# Patient Record
Sex: Female | Born: 1985 | Race: White | Hispanic: No | Marital: Single | Smoking: Current every day smoker
Health system: Southern US, Community
[De-identification: ages and names within clinical notes are randomized; demographics above are authoritative.]

---

## 2015-09-03 ENCOUNTER — Emergency Department (HOSPITAL_BASED_OUTPATIENT_CLINIC_OR_DEPARTMENT_OTHER): Payer: Medicaid Other

## 2015-09-03 ENCOUNTER — Encounter (HOSPITAL_BASED_OUTPATIENT_CLINIC_OR_DEPARTMENT_OTHER): Payer: Self-pay | Admitting: Emergency Medicine

## 2015-09-03 ENCOUNTER — Emergency Department (HOSPITAL_BASED_OUTPATIENT_CLINIC_OR_DEPARTMENT_OTHER)
Admission: EM | Admit: 2015-09-03 | Discharge: 2015-09-03 | Disposition: A | Discharge status: Home / Self Care | Payer: Medicaid Other | Attending: Emergency Medicine | Admitting: Emergency Medicine

## 2015-09-03 DIAGNOSIS — R102 Pelvic and perineal pain: Secondary | ICD-10-CM

## 2015-09-03 DIAGNOSIS — F17200 Nicotine dependence, unspecified, uncomplicated: Secondary | ICD-10-CM | POA: Diagnosis not present

## 2015-09-03 DIAGNOSIS — A5901 Trichomonal vulvovaginitis: Secondary | ICD-10-CM | POA: Diagnosis not present

## 2015-09-03 DIAGNOSIS — R1030 Lower abdominal pain, unspecified: Secondary | ICD-10-CM

## 2015-09-03 LAB — WET PREP, GENITAL
Sperm: NONE SEEN
Yeast Wet Prep HPF POC: NONE SEEN

## 2015-09-03 LAB — CBC WITH DIFFERENTIAL/PLATELET
BASOS PCT: 1 %
Basophils Absolute: 0 10*3/uL (ref 0.0–0.1)
EOS ABS: 0.3 10*3/uL (ref 0.0–0.7)
Eosinophils Relative: 4 %
HEMATOCRIT: 43.2 % (ref 36.0–46.0)
HEMOGLOBIN: 15.2 g/dL — AB (ref 12.0–15.0)
LYMPHS ABS: 1.8 10*3/uL (ref 0.7–4.0)
Lymphocytes Relative: 22 %
MCH: 30.6 pg (ref 26.0–34.0)
MCHC: 35.2 g/dL (ref 30.0–36.0)
MCV: 87.1 fL (ref 78.0–100.0)
MONOS PCT: 8 %
Monocytes Absolute: 0.6 10*3/uL (ref 0.1–1.0)
NEUTROS ABS: 5.2 10*3/uL (ref 1.7–7.7)
NEUTROS PCT: 65 %
Platelets: 327 10*3/uL (ref 150–400)
RBC: 4.96 MIL/uL (ref 3.87–5.11)
RDW: 12.6 % (ref 11.5–15.5)
WBC: 8 10*3/uL (ref 4.0–10.5)

## 2015-09-03 LAB — PREGNANCY, URINE: PREG TEST UR: NEGATIVE

## 2015-09-03 LAB — COMPREHENSIVE METABOLIC PANEL
ALBUMIN: 4 g/dL (ref 3.5–5.0)
ALK PHOS: 69 U/L (ref 38–126)
ALT: 15 U/L (ref 14–54)
ANION GAP: 8 (ref 5–15)
AST: 18 U/L (ref 15–41)
BILIRUBIN TOTAL: 0.5 mg/dL (ref 0.3–1.2)
CALCIUM: 9.2 mg/dL (ref 8.9–10.3)
CO2: 21 mmol/L — AB (ref 22–32)
CREATININE: 0.5 mg/dL (ref 0.44–1.00)
Chloride: 108 mmol/L (ref 101–111)
GFR calc Af Amer: >60 mL/min (ref >60)
GFR calc non Af Amer: >60 mL/min (ref >60)
GLUCOSE: 95 mg/dL (ref 65–99)
Potassium: 3.8 mmol/L (ref 3.5–5.1)
SODIUM: 137 mmol/L (ref 135–145)
TOTAL PROTEIN: 7.2 g/dL (ref 6.5–8.1)

## 2015-09-03 LAB — URINALYSIS, ROUTINE W REFLEX MICROSCOPIC
Bilirubin Urine: NEGATIVE
GLUCOSE, UA: NEGATIVE mg/dL
HGB URINE DIPSTICK: NEGATIVE
KETONES UR: NEGATIVE mg/dL
Nitrite: NEGATIVE
PH: 7.5 (ref 5.0–8.0)
PROTEIN: NEGATIVE mg/dL
Specific Gravity, Urine: 1.003 — ABNORMAL LOW (ref 1.005–1.030)

## 2015-09-03 LAB — URINE MICROSCOPIC-ADD ON: RBC / HPF: NONE SEEN RBC/hpf (ref 0–5)

## 2015-09-03 MED ORDER — LIDOCAINE HCL (PF) 1 % IJ SOLN
INTRAMUSCULAR | Status: AC
  Administered 2015-09-03: 2.1 mL
  Filled 2015-09-03: qty 5

## 2015-09-03 MED ORDER — MORPHINE SULFATE (PF) 4 MG/ML IV SOLN
4.0000 mg | Freq: Once | INTRAVENOUS | Status: AC
  Administered 2015-09-03: 4 mg via INTRAVENOUS
  Filled 2015-09-03: qty 1

## 2015-09-03 MED ORDER — ONDANSETRON HCL 4 MG/2ML IJ SOLN
4.0000 mg | Freq: Once | INTRAMUSCULAR | Status: AC
  Administered 2015-09-03: 4 mg via INTRAVENOUS
  Filled 2015-09-03: qty 2

## 2015-09-03 MED ORDER — METRONIDAZOLE 500 MG PO TABS
500.0000 mg | ORAL_TABLET | Freq: Once | ORAL | Status: AC
  Administered 2015-09-03: 500 mg via ORAL
  Filled 2015-09-03: qty 1

## 2015-09-03 MED ORDER — METRONIDAZOLE 500 MG PO TABS: 500.0000 mg | ORAL_TABLET | Freq: Two times a day (BID) | ORAL | 0 refills | Status: DC

## 2015-09-03 MED ORDER — AZITHROMYCIN 250 MG PO TABS
1000.0000 mg | ORAL_TABLET | Freq: Once | ORAL | Status: AC
  Administered 2015-09-03: 1000 mg via ORAL
  Filled 2015-09-03: qty 4

## 2015-09-03 MED ORDER — SODIUM CHLORIDE 0.9 % IV BOLUS (SEPSIS)
1000.0000 mL | Freq: Once | INTRAVENOUS | Status: AC
  Administered 2015-09-03: 1000 mL via INTRAVENOUS

## 2015-09-03 MED ORDER — KETOROLAC TROMETHAMINE 15 MG/ML IJ SOLN
15.0000 mg | Freq: Once | INTRAMUSCULAR | Status: AC
  Administered 2015-09-03: 15 mg via INTRAVENOUS
  Filled 2015-09-03: qty 1

## 2015-09-03 MED ORDER — CEFTRIAXONE SODIUM 250 MG IJ SOLR
250.0000 mg | Freq: Once | INTRAMUSCULAR | Status: AC
  Administered 2015-09-03: 250 mg via INTRAMUSCULAR
  Filled 2015-09-03: qty 250

## 2015-09-03 MED ORDER — TRAMADOL HCL 50 MG PO TABS: 50.0000 mg | ORAL_TABLET | Freq: Four times a day (QID) | ORAL | 0 refills | Status: DC | PRN

## 2015-09-03 NOTE — ED Triage Notes (Signed)
Patient states that she has had lower pelvic pain and abdominal pain x 2 days. Reports that she had a period 2 weeks ago but took a pregnancy tests  - 1 pos / 1 neg. Ptient states that she is having waves of pain to her lower abominal area that brings her to her knees. Also reports numbness to her arms and legs

## 2015-09-03 NOTE — ED Provider Notes (Signed)
[facility] Provider Note   CSN: [id number] Arrival date & time: 09/03/15  1113     History   Chief Complaint Chief Complaint  Patient presents with  . Pelvic Pain    HPI Autumn Hess is a 30 y.o. female.  Patient states vaginal bleeding. Several days ago and had mild abdominal pain intermittently with vomiting but today has had severe pain every 30 minutes. Denies any trauma.   The history is provided by the patient.  Pelvic Pain  This is a new problem. The current episode started 2 days ago. The problem occurs constantly. The problem has been gradually worsening. Associated symptoms include abdominal pain. Associated symptoms comments: Nausea/vomiting.  Vaginal bleeding stopped a few days ago.  Denies urinary sx or vaginal discharge.  IUD placed 8 years ago and wants it out but can't see OB until Nov.  Hx of heavy menses but not typical to have pain like this.  Worse over the left pelvis but also on the right.. The symptoms are aggravated by walking and bending. Nothing relieves the symptoms. She has tried nothing for the symptoms. The treatment provided no relief.    History reviewed. No pertinent past medical history.  There are no active problems to display for this patient.   History reviewed. No pertinent surgical history.  OB History    No data available       Home Medications    Prior to Admission medications   Not on File    Family History History reviewed. No pertinent family history.  Social History Social History  Substance Use Topics  . Smoking status: Current Every Day Smoker  . Smokeless tobacco: Never Used  . Alcohol use No     Allergies   Codeine and Sulfa antibiotics   Review of Systems Review of Systems  Gastrointestinal: Positive for abdominal pain.  Genitourinary: Positive for pelvic pain.  All other systems reviewed and are negative.    Physical Exam Updated Vital Signs BP 114/72 (BP Location: Right Arm)    Pulse 68   Temp 98.2 F (36.8 C) (Oral)   Resp 18   Ht 5\' 2"  (1.575 m)   Wt 180 lb (81.6 kg)   LMP 08/13/2015 (Approximate)   SpO2 100%   BMI 32.92 kg/m   Physical Exam  Constitutional: She is oriented to person, place, and time. She appears well-developed and well-nourished. No distress.  HENT:  Head: Normocephalic and atraumatic.  Mouth/Throat: Oropharynx is clear and moist.  Eyes: Conjunctivae and EOM are normal. Pupils are equal, round, and reactive to light.  Neck: Normal range of motion. Neck supple.  Cardiovascular: Normal rate, regular rhythm and intact distal pulses.   No murmur heard. Pulmonary/Chest: Effort normal and breath sounds normal. No respiratory distress. She has no wheezes. She has no rales.  Abdominal: Soft. She exhibits no distension. There is tenderness in the suprapubic area. There is no rebound and no guarding.    Lower pelvic tenderness bilaterally  Genitourinary: Uterus normal. Cervix exhibits no motion tenderness and no discharge. Right adnexum displays tenderness. Right adnexum displays no fullness. Left adnexum displays tenderness. Left adnexum displays no fullness. No tenderness or bleeding in the vagina. No vaginal discharge found.  Musculoskeletal: Normal range of motion. She exhibits no edema or tenderness.  Neurological: She is alert and oriented to person, place, and time.  Skin: Skin is warm and dry. No rash noted. No erythema.  Psychiatric: She has a normal mood and affect. Her behavior is  normal.  Nursing note and vitals reviewed.    ED Treatments / Results  Labs (all labs ordered are listed, but only abnormal results are displayed) Labs Reviewed  URINALYSIS, ROUTINE W REFLEX MICROSCOPIC (NOT AT [facility]) - Abnormal; Notable for the following:       Result Value   Specific Gravity, Urine 1.003 (*)    Leukocytes, UA SMALL (*)    All other components within normal limits  CBC WITH DIFFERENTIAL/PLATELET - Abnormal; Notable for the following:     Hemoglobin 15.2 (*)    All other components within normal limits  COMPREHENSIVE METABOLIC PANEL - Abnormal; Notable for the following:    CO2 21 (*)    BUN <5 (*)    All other components within normal limits  URINE MICROSCOPIC-ADD ON - Abnormal; Notable for the following:    Squamous Epithelial / LPF 0-5 (*)    Bacteria, UA FEW (*)    All other components within normal limits  WET PREP, GENITAL  PREGNANCY, URINE  GC/CHLAMYDIA PROBE AMP ([place]) NOT AT [facility]    EKG  EKG Interpretation None       Radiology No results found.  Procedures Procedures (including critical care time)  Medications Ordered in ED Medications  sodium chloride 0.9 % bolus 1,000 mL (1,000 mLs Intravenous New Bag/Given 09/03/15 1158)  morphine 4 MG/ML injection 4 mg (4 mg Intravenous Given 09/03/15 1208)  ondansetron (ZOFRAN) injection 4 mg (4 mg Intravenous Given 09/03/15 1207)     Initial Impression / Assessment and Plan / ED Course  I have reviewed the triage vital signs and the nursing notes.  Pertinent labs & imaging results that were available during my care of the patient were reviewed by me and considered in my medical decision making (see chart for details).  Clinical Course   Patient is a 30 year old healthy female who has a history of heavy menses and IUD that has been present for 8 years presenting today with lower abdominal/pelvic tenderness which causes nausea and vomiting that started 2 days ago. Patient states that she had an extremely heavy. This past week with passage of large clots which is now resolved but abdominal pain started 2 days ago. Is in the lower pelvic region and seems to be getting worse. When the pain is severe causes her to vomit but she denies any fever or bowel changes. No urinary symptoms and denies any vaginal discharge.  She is requesting IUD removal as she is been trying to get it out but can't get an appointment with with her GYN until November. On exam  patient's IUD was removed. Visual exam is within normal limits however she has significant tenderness with adnexal exam. Left adnexa is or tender than right adnexa. Concern for potential torsion versus ruptured ovarian cyst versus fibroids as her cause for pain. Low suspicion for appendicitis, diverticulitis, hepatitis or pancreatitis. Patient given pain control and ultrasound pending. CBC, CMP and UA all within normal limits. UPT is negative  2:09 PM Ultrasound without acute finding of torsion, cyst or tubo-ovarian abscess. Wet prep was positive for trichomonas. Patient treated with Rocephin, azithromycin and Flagyl.   Final Clinical Impressions(s) / ED Diagnoses   Final diagnoses:  Pelvic pain in female  Trichomonas vaginalis (TV) infection  Lower abdominal pain    New Prescriptions New Prescriptions   METRONIDAZOLE (FLAGYL) 500 MG TABLET    Take 1 tablet (500 mg total) by mouth 2 (two) times daily.   TRAMADOL (ULTRAM) 50 MG TABLET  Take 1 tablet (50 mg total) by mouth every 6 (six) hours as needed.     Gwyneth Sprout, MD 09/03/15 1410

## 2015-09-04 LAB — GC/CHLAMYDIA PROBE AMP (~~LOC~~) NOT AT ARMC
CHLAMYDIA, DNA PROBE: NEGATIVE
Neisseria Gonorrhea: NEGATIVE

## 2017-08-18 IMAGING — US US ART/VEN ABD/PELV/SCROTUM DOPPLER LTD
1 series · 13 of 25 positions shown · non-contrast
Comparison: None

CLINICAL DATA: Diffuse pelvic pain for 4 days.

EXAM:
TRANSABDOMINAL AND TRANSVAGINAL ULTRASOUND OF PELVIS
DOPPLER ULTRASOUND OF OVARIES
TECHNIQUE: Both transabdominal and transvaginal ultrasound examinations of the
pelvis were performed. Transabdominal technique was performed for
global imaging of the pelvis including uterus, ovaries, adnexal
regions, and pelvic cul-de-sac.
It was necessary to proceed with endovaginal exam following the
transabdominal exam to visualize the uterus, endometrium, ovaries
and adnexa . Color and duplex Doppler ultrasound was utilized to
evaluate blood flow to the ovaries.

[Series 1: us art/ven abd/pelv/scrotum doppler ltd · 0.24mm/px · 13 of 92 slices shown]
[im 1/92]
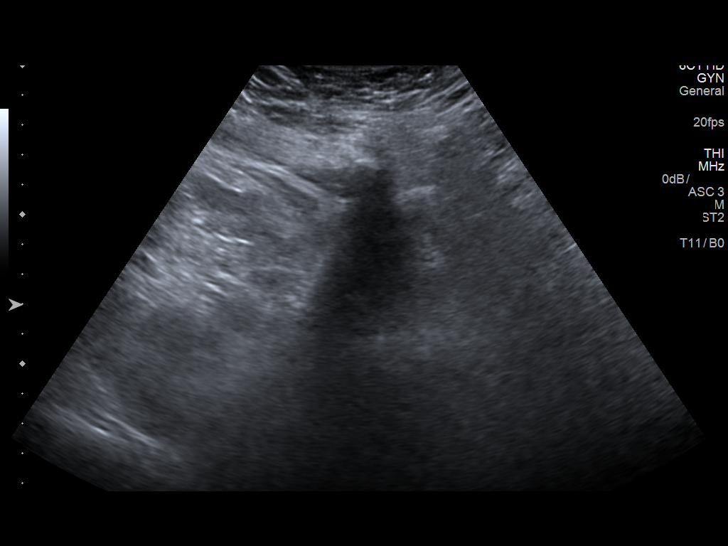
[im 8/92]
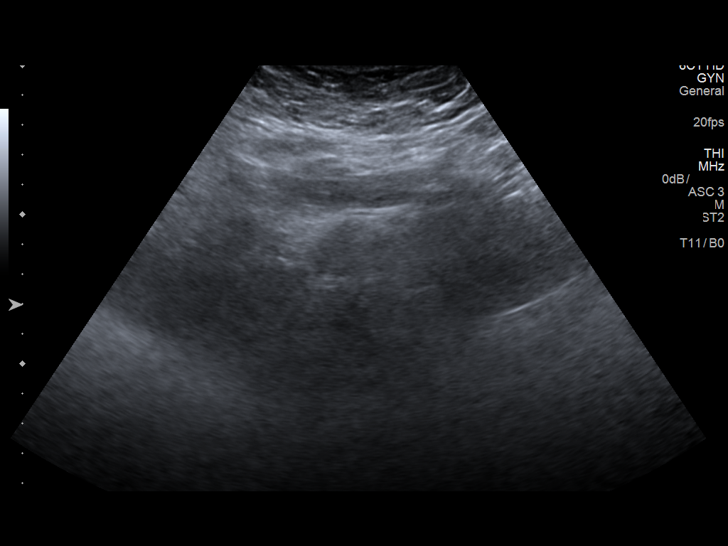
[im 16/92]
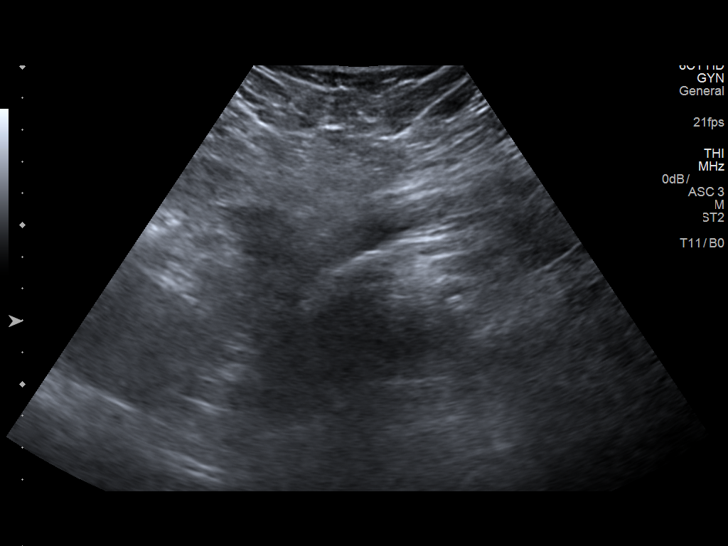
[im 23/92]
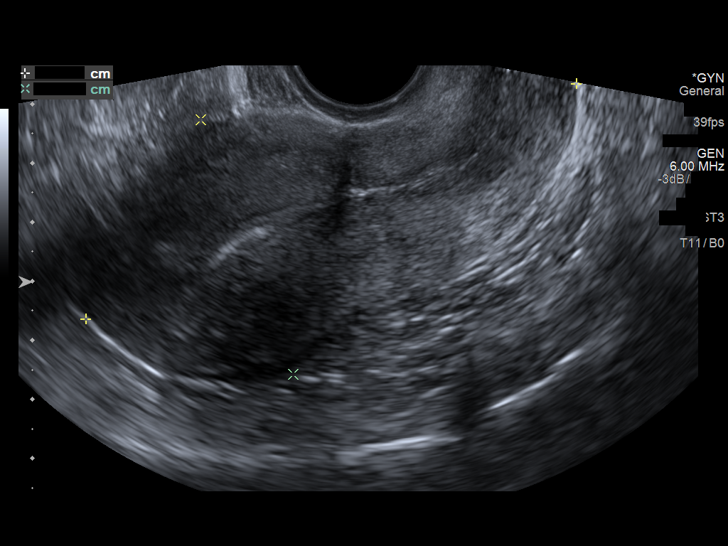
[im 31/92]
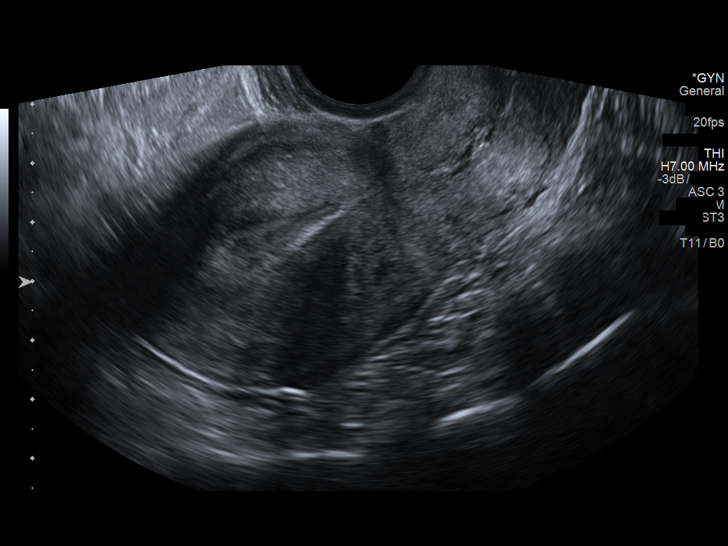
[im 38/92]
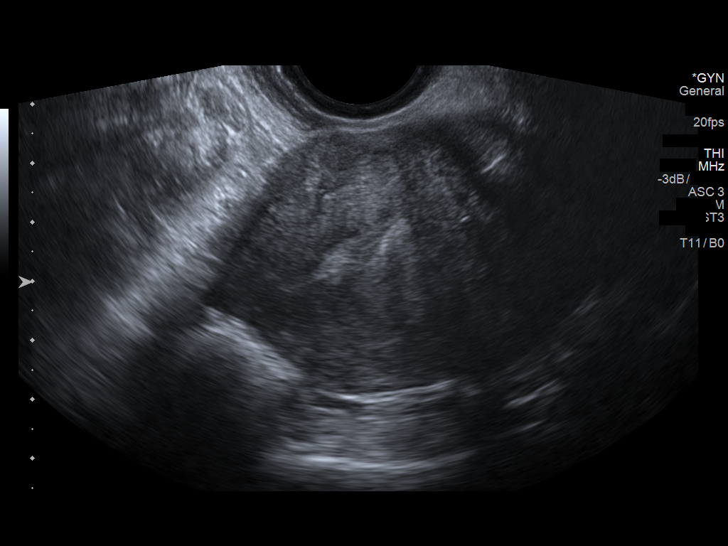
[im 46/92]
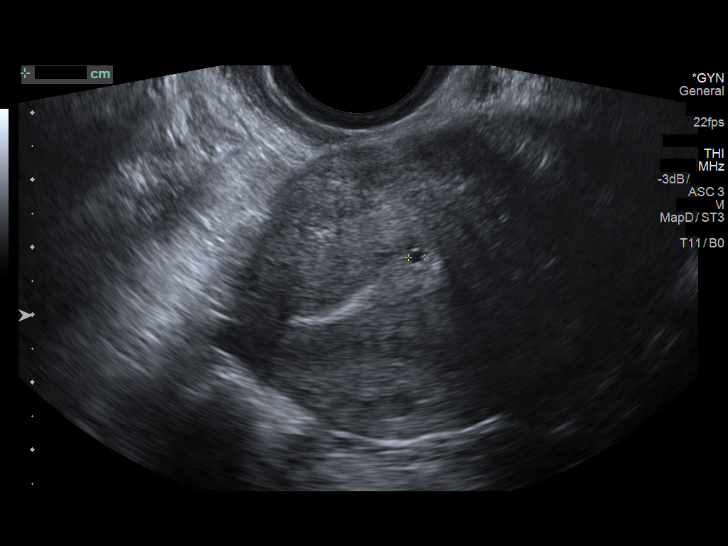
[im 54/92]
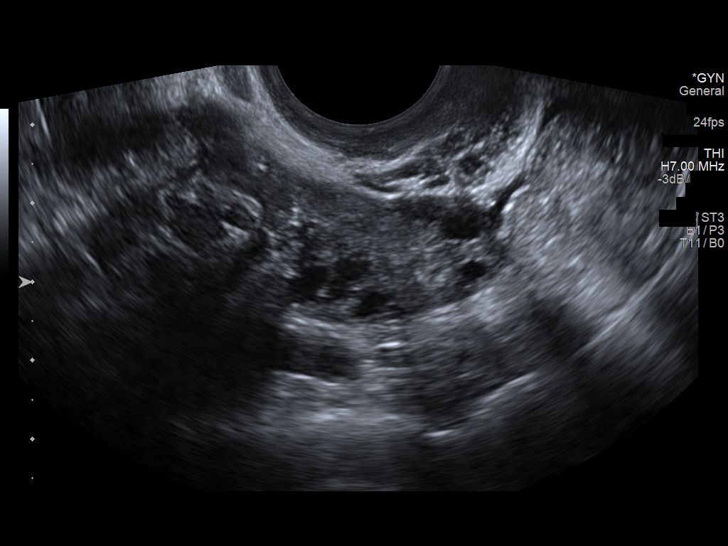
[im 61/92]
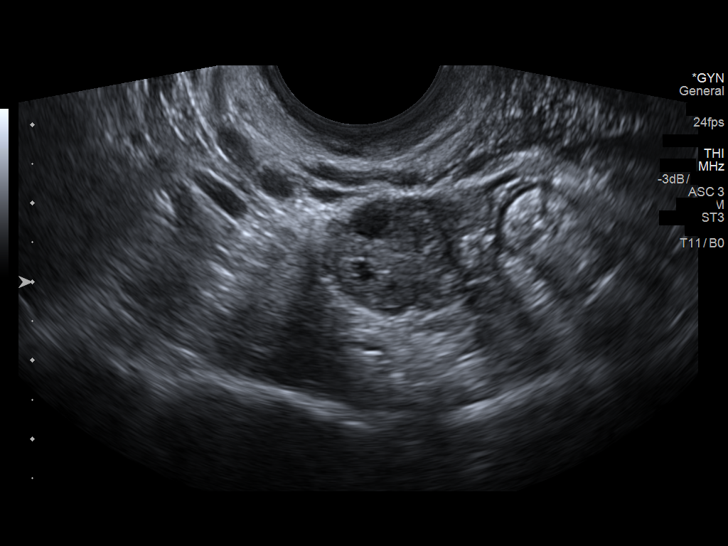
[im 69/92]
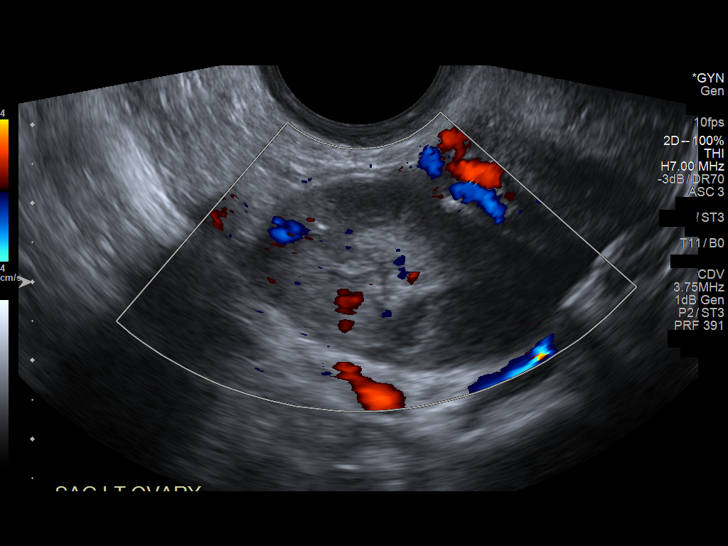
[im 76/92]
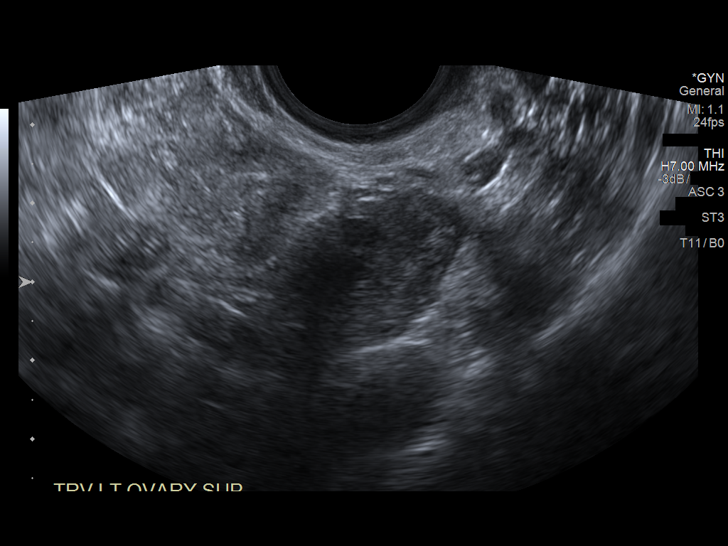
[im 84/92]
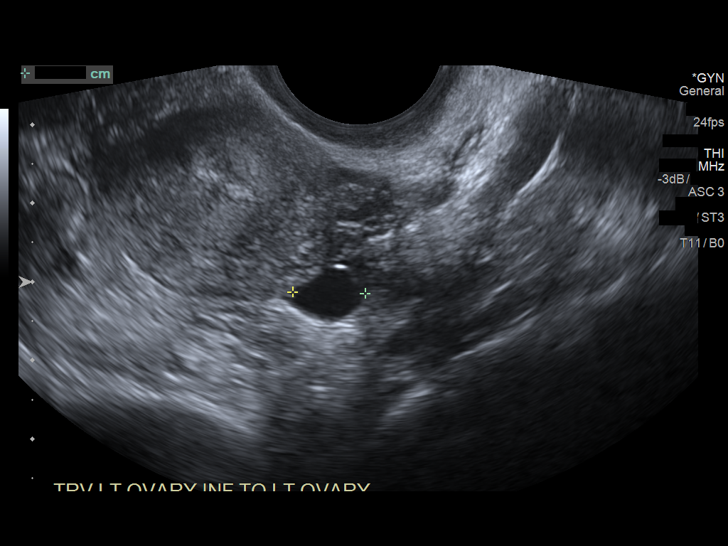
[im 92/92]
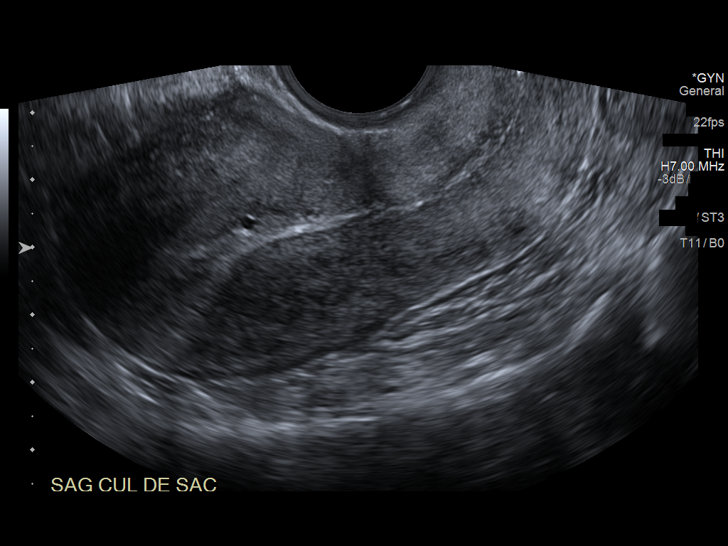

[13 of 25 positions shown; findings below may reference images not displayed]

FINDINGS: Uterus

Measurements: 9.2 x 4.6 x 4.8 cm.. No fibroids or other mass
visualized.

Endometrium

Thickness: 11 mm in thickness. Small 3 mm cyst. No significant focal
abnormality.

Right ovary

Measurements: 4.0 x 1.9 x 1.9 cm. Normal appearance/no adnexal mass.

Left ovary

Measurements: 3.8 x 2.5 x 1.9 cm. Normal appearance/no adnexal mass.

Pulsed Doppler evaluation of both ovaries demonstrates normal
low-resistance arterial and venous waveforms.

Other findings

Trace free fluid.
IMPRESSION: No significant abnormality.  No acute findings.

## 2023-09-12 ENCOUNTER — Ambulatory Visit

## 2023-09-12 ENCOUNTER — Ambulatory Visit: Admission: RE | Admit: 2023-09-12 | Discharge: 2023-09-12 | Disposition: A | Discharge status: Home / Self Care

## 2023-09-12 ENCOUNTER — Other Ambulatory Visit: Payer: Self-pay

## 2023-09-12 VITALS — BP 122/72 | HR 77 | Temp 98.3°F | Resp 18

## 2023-09-12 DIAGNOSIS — M79671 Pain in right foot: Secondary | ICD-10-CM

## 2023-09-12 DIAGNOSIS — Z23 Encounter for immunization: Secondary | ICD-10-CM | POA: Diagnosis not present

## 2023-09-12 DIAGNOSIS — S91331A Puncture wound without foreign body, right foot, initial encounter: Secondary | ICD-10-CM

## 2023-09-12 MED ORDER — TETANUS-DIPHTH-ACELL PERTUSSIS 5-2.5-18.5 LF-MCG/0.5 IM SUSY
0.5000 mL | PREFILLED_SYRINGE | Freq: Once | INTRAMUSCULAR | Status: AC
  Administered 2023-09-12: 0.5 mL via INTRAMUSCULAR

## 2023-09-12 MED ORDER — AMOXICILLIN-POT CLAVULANATE 875-125 MG PO TABS: 1.0000 | ORAL_TABLET | Freq: Two times a day (BID) | ORAL | 0 refills | Status: AC

## 2023-09-12 MED ORDER — CIPROFLOXACIN HCL 500 MG PO TABS: 500.0000 mg | ORAL_TABLET | Freq: Two times a day (BID) | ORAL | 0 refills | Status: AC

## 2023-09-12 NOTE — Discharge Instructions (Addendum)
 I have sent two antibiotics (Augmentin  and Cipro ) for you to take by mouth for your puncture wound. These are often used to prevent skin infections. Take them as directed.   Return in 3-4 days if no improvement. It is very important for you to pay attention to any new symptoms or worsening of your current condition. Please go directly to the Emergency Department immediately should you begin to feel worse in any way or have any of the following symptoms: persistent fevers, increased pain, increased swelling, increased redness, redness outside the demarcation or streaking up your leg/foot.
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
## 2023-09-12 NOTE — ED Notes (Signed)
 Dressing applied with bacitracin ointment, telfa, kling and flex net.

## 2023-09-12 NOTE — ED Triage Notes (Signed)
 Patient states she stepped on a nail while bare footed today. Puncture wound right foot. Patient cleaned wound with peroxide. Patient took tylenol. No bleeding at this time.

## 2023-10-26 ENCOUNTER — Ambulatory Visit
Admission: RE | Admit: 2023-10-26 | Discharge: 2023-10-26 | Disposition: A | Discharge status: Home / Self Care | Attending: Internal Medicine

## 2023-10-26 VITALS — BP 119/80 | HR 79 | Temp 98.6°F | Resp 16 | Wt 170.0 lb

## 2023-10-26 DIAGNOSIS — N61 Mastitis without abscess: Secondary | ICD-10-CM

## 2023-10-26 MED ORDER — DOXYCYCLINE HYCLATE 100 MG PO CAPS: 100.0000 mg | ORAL_CAPSULE | Freq: Two times a day (BID) | ORAL | 0 refills | Status: AC

## 2023-10-26 NOTE — ED Triage Notes (Signed)
 Patient states wound under right breast for 1 week, bigger and more painful now, states she gets these sometimes but usually not painful.

## 2023-10-26 NOTE — Discharge Instructions (Signed)
 I have sent an antibiotic (Doxycycline) for you to take by mouth for your infection. Take as directed.   Return in 3-4 days if no improvement. It is very important for you to pay attention to any new symptoms or worsening of your current condition. Please go directly to the Emergency Department immediately should you begin to feel worse in any way or have any of the following symptoms: persistent fevers, increased pain, increased swelling, increased redness, redness outside the demarcation or streaking up your breast.

## 2023-10-26 NOTE — ED Provider Notes (Signed)
 [facility]  Note:  This document was prepared using Dragon voice recognition software and may include unintentional dictation errors.  MRN: [id number] DOB: [birth year] DATE: 10/26/23   Subjective:  Chief Complaint:  Chief Complaint  Patient presents with   Wound Check    There is a place on my right breast that's hot to the touch and painful - Entered by patient     HPI: Autumn Hess is a 38 y.o. female presenting for lesion under her right breast for the past week. Patient reports lesion medially of right breast. She states that the area looked like a pimple at first a week ago, but has since gotten bigger and more painful. She reports history of abscess in the same area almost 10 years ago. She reports the area has become redder as well. She has not taken anything for her symptoms. She reports no discharge from the lesion. She has never gotten an abscess anywhere else beside the right breast in the same general area. Denies fever, nausea/vomiting, discharge. Endorses skin lesion of right breast. Presents NAD.  Prior to Admission medications   Medication Sig Start Date End Date Taking? Authorizing Provider  busPIRone (BUSPAR) 10 MG tablet Take 10 mg by mouth. 11/11/22   [provider]  escitalopram (LEXAPRO) 20 MG tablet Take 20 mg by mouth. 02/04/19   [provider]     Allergies  Allergen Reactions   Codeine Other (See Comments)    unkown    Sulfa Antibiotics Other (See Comments)    unknown    History:   History reviewed. No pertinent past medical history.   History reviewed. No pertinent surgical history.  History reviewed. No pertinent family history.  Social History   Tobacco Use   Smoking status: Every Day    Types: Cigarettes   Smokeless tobacco: Never  Vaping Use   Vaping status: Never Used  Substance Use Topics   Alcohol use: Yes    Alcohol/week: 3.0 standard drinks of alcohol    Types: 3 Cans of beer per week   Drug use: No     Review of Systems  Constitutional:  Negative for fever.  Gastrointestinal:  Negative for abdominal pain, nausea and vomiting.  Skin:  Positive for rash.     Objective:   Vitals: BP 119/80 (BP Location: Right Arm)   Pulse 79   Temp 98.6 F (37 C) (Oral)   Resp 16   Wt 170 lb (77.1 kg)   LMP 08/13/2015 (Approximate)   SpO2 97%   BMI 31.09 kg/m   Physical Exam Constitutional:      General: She is not in acute distress.    Appearance: Normal appearance. She is well-developed. She is obese. She is not ill-appearing or toxic-appearing.  HENT:     Head: Normocephalic and atraumatic.  Cardiovascular:     Rate and Rhythm: Normal rate and regular rhythm.     Heart sounds: Normal heart sounds.  Pulmonary:     Effort: Pulmonary effort is normal.     Breath sounds: Normal breath sounds.     Comments: Clear to auscultation bilaterally  Chest:     Chest wall: Tenderness present.  Breasts:    Right: Skin change present.       Comments: Patient has circular, erythematous lesion under right breast at 5 o'clock position. Area is tender. No discharge on exam. Skin breakdown noted at the lesion. Area is non fluctuant.  Abdominal:     General: Bowel sounds are  normal.     Palpations: Abdomen is soft.     Tenderness: There is no abdominal tenderness.  Skin:    General: Skin is warm and dry.     Findings: Erythema and lesion present.  Neurological:     General: No focal deficit present.     Mental Status: She is alert.  Psychiatric:        Mood and Affect: Mood and affect normal.     Results:  Labs: No results found for this or any previous visit (from the past 24 hours).  Radiology: No results found.   UC Course/Treatments:  Procedures: Procedures   Medications Ordered in UC: Medications - No data to display   Assessment and Plan :     ICD-10-CM   1. Cellulitis of right breast  N61.0      Cellulitis of right breast Afebrile, nontoxic-appearing, NAD.  VSS. DDX includes but not limited to: abscess, cellulitis, infected sebaceous cyst, mastitis, malignancy Lesion under right breast is non fluctuant, but erythematous and tender to palpation. Suspect cellulitis. Doxycycline 100mg  BID was prescribed. Recommend warm compresses to the area. If area becomes enlarged or fluctuant, recommend follow up for I&D. Strict ED precautions were given and patient verbalized understanding.  ED Discharge Orders          Ordered    doxycycline (VIBRAMYCIN) 100 MG capsule  2 times daily        10/26/23 1513             PDMP not reviewed this encounter.     Yaelis Scharfenberg P, PA-C 10/26/23 1521
